# Patient Record
Sex: Male | Born: 1995 | Race: White | Hispanic: No | Marital: Single | Smoking: Former smoker
Health system: Southern US, Community
[De-identification: ages and names within clinical notes are randomized; demographics above are authoritative.]

---
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
## 2014-09-18 ENCOUNTER — Encounter: Payer: Self-pay | Admitting: Orthopedic Surgery

## 2014-09-21 ENCOUNTER — Emergency Department (HOSPITAL_COMMUNITY)
Admission: EM | Admit: 2014-09-21 | Discharge: 2014-09-21 | Disposition: A | Discharge status: Home / Self Care | Payer: BLUE CROSS/BLUE SHIELD | Attending: Emergency Medicine | Admitting: Emergency Medicine

## 2014-09-21 ENCOUNTER — Encounter (HOSPITAL_COMMUNITY): Payer: Self-pay | Admitting: *Deleted

## 2014-09-21 DIAGNOSIS — Y9289 Other specified places as the place of occurrence of the external cause: Secondary | ICD-10-CM | POA: Insufficient documentation

## 2014-09-21 DIAGNOSIS — IMO0002 Reserved for concepts with insufficient information to code with codable children: Secondary | ICD-10-CM

## 2014-09-21 DIAGNOSIS — Y9389 Activity, other specified: Secondary | ICD-10-CM | POA: Diagnosis not present

## 2014-09-21 DIAGNOSIS — Y998 Other external cause status: Secondary | ICD-10-CM | POA: Diagnosis not present

## 2014-09-21 DIAGNOSIS — S61012A Laceration without foreign body of left thumb without damage to nail, initial encounter: Secondary | ICD-10-CM | POA: Insufficient documentation

## 2014-09-21 DIAGNOSIS — W260XXA Contact with knife, initial encounter: Secondary | ICD-10-CM | POA: Diagnosis not present

## 2014-09-21 DIAGNOSIS — Z791 Long term (current) use of non-steroidal anti-inflammatories (NSAID): Secondary | ICD-10-CM | POA: Insufficient documentation

## 2014-09-21 MED ORDER — LIDOCAINE HCL (PF) 1 % IJ SOLN
INTRAMUSCULAR | Status: AC
  Administered 2014-09-21: 18:00:00
  Filled 2014-09-21: qty 5

## 2014-09-21 NOTE — ED Notes (Signed)
Non-adherent dressing applied.

## 2014-09-21 NOTE — ED Provider Notes (Signed)
CSN: [id number]     Arrival date & time 09/21/14  1557 History   First MD Initiated Contact with Patient 09/21/14 1727     Chief Complaint  Patient presents with  . Extremity Laceration     (Consider location/radiation/quality/duration/timing/severity/associated sxs/prior Treatment) The history is provided by the patient.   Calvin Huerta is a 19 y.o. right handed male presenting with laceration of his left distal thumb occuring when he opened his brand new pocket knife.  He denies numbness in the finger, but endorses copious bleeding, but controlled with pressure.  He denies other injury.  His tetanus is utd.    History reviewed. No pertinent past medical history. History reviewed. No pertinent past surgical history. History reviewed. No pertinent family history. History  Substance Use Topics  . Smoking status: Former Games developermoker  . Smokeless tobacco: Not on file  . Alcohol Use: No    Review of Systems  Constitutional: Negative.   Skin: Positive for wound.  Neurological: Negative for weakness and numbness.      Allergies  Review of patient's allergies indicates no known allergies.  Home Medications   Prior to Admission medications   Medication Sig Start Date End Date Taking? Authorizing Provider  ibuprofen (ADVIL,MOTRIN) 800 MG tablet Take 1 tablet (800 mg total) by mouth 3 (three) times daily. 05/25/13   Kathie DikeHobson M Bryant, PA-C   BP 93/70 mmHg  Pulse 78  Temp(Src) 100.7 F (38.2 C) (Core (Comment))  Resp 18  SpO2 100% Physical Exam  Constitutional: He is oriented to person, place, and time. He appears well-developed and well-nourished.  HENT:  Head: Normocephalic.  Cardiovascular: Normal rate.   Pulmonary/Chest: Effort normal.  Musculoskeletal: He exhibits tenderness.  Neurological: He is alert and oriented to person, place, and time. No sensory deficit.  Skin: Laceration noted.  1 cm subcutaneous linear laceration left distal thumb not involving nail plate.   Hemostatic.  Distal sensation normal.  FROM of digit.    ED Course  Procedures (including critical care time)  LACERATION REPAIR Performed by: Burgess AmorIDOL, Lindsea Olivar Authorized by: Burgess AmorIDOL, Rhealynn Myhre Consent: Verbal consent obtained. Risks and benefits: risks, benefits and alternatives were discussed Consent given by: patient Patient identity confirmed: provided demographic data Prepped and Draped in normal sterile fashion Wound explored  Laceration Location: left thumb  Laceration Length: 1cm  No Foreign Bodies seen or palpated  Anesthesia: Digital block Local anesthetic: lidocaine 1% without epinephrine  Anesthetic total: 2 ml  Irrigation method: syringe Amount of cleaning: standard  Skin closure: Ethilon 4-0  Number of sutures: 3   Technique: Simple interrupted   Patient tolerance: Patient tolerated the procedure well with no immediate complications.  Labs Review Labs Reviewed - No data to display  Imaging Review No results found.   EKG Interpretation None      MDM   Final diagnoses:  Laceration    Wound care instructions given.  Pt advised to have sutures removed in 10 days,  Return here sooner for any signs of infection including redness, swelling, worse pain or drainage of pus.       Burgess AmorJulie Jerren Flinchbaugh, PA-C 09/22/14 1341  Donnetta HutchingBrian Cook, MD 09/24/14 1728

## 2014-09-21 NOTE — ED Notes (Signed)
Laceration to L thumb w/knife.

## 2014-09-21 NOTE — Discharge Instructions (Signed)

## 2014-10-16 ENCOUNTER — Encounter: Payer: Self-pay | Admitting: Orthopedic Surgery

## 2021-02-10 DIAGNOSIS — J302 Other seasonal allergic rhinitis: Secondary | ICD-10-CM | POA: Diagnosis not present

## 2021-02-10 DIAGNOSIS — Z681 Body mass index (BMI) 19 or less, adult: Secondary | ICD-10-CM | POA: Diagnosis not present

## 2021-04-02 DIAGNOSIS — U071 COVID-19: Secondary | ICD-10-CM | POA: Diagnosis not present

## 2021-07-09 DIAGNOSIS — J302 Other seasonal allergic rhinitis: Secondary | ICD-10-CM | POA: Diagnosis not present

## 2021-07-09 DIAGNOSIS — R0981 Nasal congestion: Secondary | ICD-10-CM | POA: Diagnosis not present
